# Patient Record
Sex: Female | Born: 1990 | Race: Black or African American | Hispanic: No | Marital: Single | Smoking: Never smoker
Health system: Southern US, Community
[De-identification: ages and names within clinical notes are randomized; demographics above are authoritative.]

---
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
## 2010-10-05 LAB — URINALYSIS, ROUTINE W REFLEX MICROSCOPIC
Glucose, UA: 100 — AB
pH: 6

## 2010-10-05 LAB — URINE MICROSCOPIC-ADD ON

## 2017-10-27 ENCOUNTER — Encounter (HOSPITAL_BASED_OUTPATIENT_CLINIC_OR_DEPARTMENT_OTHER): Payer: Self-pay | Admitting: *Deleted

## 2017-10-27 ENCOUNTER — Emergency Department (HOSPITAL_BASED_OUTPATIENT_CLINIC_OR_DEPARTMENT_OTHER)
Admission: EM | Admit: 2017-10-27 | Discharge: 2017-10-27 | Disposition: A | Discharge status: Home / Self Care | Payer: BC Managed Care – PPO | Attending: Emergency Medicine | Admitting: Emergency Medicine

## 2017-10-27 ENCOUNTER — Other Ambulatory Visit: Payer: Self-pay

## 2017-10-27 ENCOUNTER — Emergency Department (HOSPITAL_BASED_OUTPATIENT_CLINIC_OR_DEPARTMENT_OTHER): Payer: BC Managed Care – PPO

## 2017-10-27 DIAGNOSIS — Y9389 Activity, other specified: Secondary | ICD-10-CM | POA: Insufficient documentation

## 2017-10-27 DIAGNOSIS — Y929 Unspecified place or not applicable: Secondary | ICD-10-CM | POA: Insufficient documentation

## 2017-10-27 DIAGNOSIS — W1849XA Other slipping, tripping and stumbling without falling, initial encounter: Secondary | ICD-10-CM | POA: Insufficient documentation

## 2017-10-27 DIAGNOSIS — S76311A Strain of muscle, fascia and tendon of the posterior muscle group at thigh level, right thigh, initial encounter: Secondary | ICD-10-CM | POA: Diagnosis not present

## 2017-10-27 DIAGNOSIS — Y999 Unspecified external cause status: Secondary | ICD-10-CM | POA: Diagnosis not present

## 2017-10-27 DIAGNOSIS — M79604 Pain in right leg: Secondary | ICD-10-CM | POA: Diagnosis present

## 2017-10-27 MED ORDER — IBUPROFEN 800 MG PO TABS: 800.0000 mg | ORAL_TABLET | Freq: Three times a day (TID) | ORAL | 0 refills | Status: AC | PRN

## 2017-10-27 MED ORDER — IBUPROFEN 800 MG PO TABS
800.0000 mg | ORAL_TABLET | Freq: Once | ORAL | Status: AC
  Administered 2017-10-27: 800 mg via ORAL
  Filled 2017-10-27: qty 1

## 2017-10-27 NOTE — ED Notes (Signed)
ED Provider at bedside. 

## 2017-10-27 NOTE — ED Triage Notes (Signed)
Pt states she was running and her boot caught and she fell landing on her right knee. C/o pain with movement and walking. Also c/o pain posterior right thigh

## 2017-10-27 NOTE — ED Provider Notes (Signed)
[facility] EMERGENCY DEPARTMENT Provider Note   CSN: [id number] Arrival date & time: 10/27/17  1928     History   Chief Complaint Chief Complaint  Patient presents with  . Knee Injury    HPI Amy Acosta is a 27 y.o. female.  No significant past medical history.  She is here for evaluation of knee and hamstring pain after a fall.  She said she was running around chasing her nieces and nephews and slipped, and then had posterior leg pain.  She is tried nothing for it.  It is worse with any kind of weightbearing or ambulation.  No fevers no chills no abdominal pain.  No numbness or weakness.  The history is provided by the patient.  Leg Pain   This is a new problem. The current episode started 3 to 5 hours ago. The problem occurs constantly. The problem has not changed since onset.The pain is present in the right upper leg. The quality of the pain is described as sharp. The pain is moderate. Associated symptoms include limited range of motion. Pertinent negatives include no numbness. The symptoms are aggravated by activity and standing. She has tried nothing for the symptoms. The treatment provided no relief.    History reviewed. No pertinent past medical history.  There are no active problems to display for this patient.   History reviewed. No pertinent surgical history.   OB History   None      Home Medications    Prior to Admission medications   Not on File    Family History No family history on file.  Social History Social History   Tobacco Use  . Smoking status: Never Smoker  . Smokeless tobacco: Never Used  Substance Use Topics  . Alcohol use: Not Currently  . Drug use: Never     Allergies   Patient has no known allergies.   Review of Systems Review of Systems  Constitutional: Negative for fever.  Respiratory: Negative for shortness of breath.   Cardiovascular: Negative for chest pain.  Gastrointestinal: Negative for abdominal  pain.  Musculoskeletal: Negative for back pain.  Skin: Negative for rash.  Neurological: Negative for numbness.     Physical Exam Updated Vital Signs BP 125/88 (BP Location: Left Arm)   Pulse (!) 18   Temp 98.5 F (36.9 C) (Oral)   Resp 18   Ht 5\' 5"  (1.651 m)   Wt 77.1 kg   LMP 10/24/2017   SpO2 100%   BMI 28.29 kg/m   Physical Exam  Constitutional: She appears well-developed and well-nourished.  HENT:  Head: Normocephalic and atraumatic.  Eyes: Conjunctivae are normal.  Neck: Neck supple.  Musculoskeletal:  She is no pain around the knee.  No significant effusion.  Her extensor mechanism is intact.  Calf and ankle nontender.  She has posterior thigh tenderness in the hamstring about midway.  She has pain with any kind of range of motion of that area.  No hip or back pain.  Neurological: She is alert. GCS eye subscore is 4. GCS verbal subscore is 5. GCS motor subscore is 6.  Skin: Skin is warm and dry.  Psychiatric: She has a normal mood and affect.     ED Treatments / Results  Labs (all labs ordered are listed, but only abnormal results are displayed) Labs Reviewed - No data to display  EKG None  Radiology Dg Knee Complete 4 Views Right  Result Date: 10/27/2017 CLINICAL DATA:  Fall on right knee, pain  EXAM: RIGHT KNEE - COMPLETE 4+ VIEW COMPARISON:  None. FINDINGS: No evidence of fracture, dislocation, or joint effusion. No evidence of arthropathy or other focal bone abnormality. Soft tissues are unremarkable. IMPRESSION: Negative. Electronically Signed   By: Charlett Nose M.D.   On: 10/27/2017 20:00    Procedures Procedures (including critical care time)  Medications Ordered in ED Medications - No data to display   Initial Impression / Assessment and Plan / ED Course  I have reviewed the triage vital signs and the nursing notes.  Pertinent labs & imaging results that were available during my care of the patient were reviewed by me and considered in my  medical decision making (see chart for details).    She has no particular pain about the knee but definitely has some mid hamstring pain.  I do not feel a palpable defect but she is quite tender there.  We placed her on some crutches and I referred her onto sports medicine for further evaluation.   Final Clinical Impressions(s) / ED Diagnoses   Final diagnoses:  Hamstring strain, right, initial encounter    ED Discharge Orders         Ordered    ibuprofen (ADVIL,MOTRIN) 800 MG tablet  Every 8 hours PRN     10/27/17 2037           Terrilee Files, MD 10/27/17 [phone]

## 2017-10-27 NOTE — Discharge Instructions (Signed)
You were evaluated in the emergency room for pain in the back of your thigh after a fall.  Your knee x-rays did not show any acute fractures.  He seemed most tender in the hamstring and there is likely a tear in that area.  you should use ice to the affected area and take ibuprofen for pain and inflammation.

## 2017-10-28 ENCOUNTER — Encounter: Payer: Self-pay | Admitting: Family Medicine

## 2017-10-28 ENCOUNTER — Ambulatory Visit: Payer: BC Managed Care – PPO | Admitting: Family Medicine

## 2017-10-28 VITALS — BP 137/80 | HR 101 | Ht 66.0 in | Wt 170.0 lb

## 2017-10-28 DIAGNOSIS — S76311A Strain of muscle, fascia and tendon of the posterior muscle group at thigh level, right thigh, initial encounter: Secondary | ICD-10-CM

## 2017-10-28 DIAGNOSIS — S83411A Sprain of medial collateral ligament of right knee, initial encounter: Secondary | ICD-10-CM | POA: Diagnosis not present

## 2017-10-28 DIAGNOSIS — M25561 Pain in right knee: Secondary | ICD-10-CM | POA: Diagnosis not present

## 2017-10-28 NOTE — Progress Notes (Signed)
PCP: [facility]  Subjective:   HPI: Patient is a 27 y.o. female here for right knee pain. Pt reports 8/10 right knee pain since yesterday, sharp. Pt states she tripped and fell forward and twisted her leg. She impacted the anterior lateral knee. She went to the ED yesterday and had xrays performed. Today she reports swelling, knee and hamstring pain. She has 8/10 pain worse with weight bearing and has been using crutches. She also has pain with flexion of the knee. She takes ibuprofen which is minimally helpful. Effusion present. No erythema or bruising. No increased warmth, no numbness or tingling. No skin changes.  No past medical history on file.  Current Outpatient Medications on File Prior to Visit  Medication Sig Dispense Refill  . diphenhydrAMINE (BENADRYL) 25 MG tablet Take 50 mg by mouth at bedtime.    Marland Kitchen ibuprofen (ADVIL,MOTRIN) 800 MG tablet Take 1 tablet (800 mg total) by mouth every 8 (eight) hours as needed for moderate pain. 21 tablet 0   No current facility-administered medications on file prior to visit.     No past surgical history on file.  No Known Allergies  Social History   Socioeconomic History  . Marital status: Single    Spouse name: Not on file  . Number of children: Not on file  . Years of education: Not on file  . Highest education level: Not on file  Occupational History  . Not on file  Social Needs  . Financial resource strain: Not on file  . Food insecurity:    Worry: Not on file    Inability: Not on file  . Transportation needs:    Medical: Not on file    Non-medical: Not on file  Tobacco Use  . Smoking status: Never Smoker  . Smokeless tobacco: Never Used  Substance and Sexual Activity  . Alcohol use: Not Currently  . Drug use: Never  . Sexual activity: Not on file  Lifestyle  . Physical activity:    Days per week: Not on file    Minutes per session: Not on file  . Stress: Not on file  Relationships  . Social  connections:    Talks on phone: Not on file    Gets together: Not on file    Attends religious service: Not on file    Active member of club or organization: Not on file    Attends meetings of clubs or organizations: Not on file    Relationship status: Not on file  . Intimate partner violence:    Fear of current or ex partner: Not on file    Emotionally abused: Not on file    Physically abused: Not on file    Forced sexual activity: Not on file  Other Topics Concern  . Not on file  Social History Narrative  . Not on file    No family history on file.  BP 137/80   Pulse (!) 101   Ht 5\' 6"  (1.676 m)   Wt 170 lb (77.1 kg)   LMP 10/24/2017   BMI 27.44 kg/m   Review of Systems: See HPI above.     Objective:  Physical Exam:  Gen: awake, alert, NAD, comfortable in exam room Pulm: breathing unlabored  Right Knee: - Inspection: no gross deformity. Moderate effusion. No erythema or bruising. Skin intact - Palpation: TTP over the medial and lateral knee joint lines. TTP over the biceps femoris at musculotendinous junction. Tendons palpable and intact - ROM: full extension  at the knee. Flexion limited to due pain/pressure - Strength: 4+/5 strength in knee flexion, 5/5 extension. 5/5 strength at the ankle - Neuro/vasc: NV intact - Special Tests: - LIGAMENTS: ACL feels intact with ant drawer however testing limited due to pt anxiety and guarding. MCL laxity with firm endpoint compared to left. LCL normal  -- MENISCUS: minimal pain with mcmurrays. unable to perform thessaly -- PF JOINT: nml patellar mobility without apprehension  Left Knee: - Inspection: no gross deformity. Noeffusion, erythema or bruising - Palpation: no TTP - ROM: Full ROM of the knee - Strength: 5/5 strength - Neuro/vasc: NV intact - Special Tests: - LIGAMENTS: No ACL laxity. No MCL or LCL laxity   Hips: no pain with passive IR/ER   Assessment & Plan:  1.  Right knee pain-patient likely has a  combination of hamstring strain, MCL sprain, and knee contusion.  Based on her guarding we are unable to adequately test her ACL today.  With the degree of effusion she has, there is concern for intra-articular derangement. - Today we will place her in a hinged knee brace for support - Quadricep strengthening - Continue NSAIDs or Tylenol as needed for pain - Ice 15 minutes 3-4 times a day - Follow-up in 2 weeks

## 2017-10-28 NOTE — Patient Instructions (Signed)
You have sprained your MCL, strained your hamstring, and have a knee contusion. Icing 15 minutes at a time 3-4 times a day. Ibuprofen 600mg  three times a day with food for pain and inflammation. Tylenol as needed for pain is ok in addition to this. Knee brace when up and walking around. Straight leg raises, knee extensions 3 sets of 10 once a day. Follow up with me in 2 weeks for reevaluation.

## 2017-10-29 ENCOUNTER — Encounter: Payer: Self-pay | Admitting: Family Medicine

## 2017-11-04 ENCOUNTER — Telehealth: Payer: Self-pay | Admitting: Family Medicine

## 2017-11-04 NOTE — Telephone Encounter (Signed)
Patient was informed. Will call the office if she starts to experience pain in her calf

## 2017-11-04 NOTE — Telephone Encounter (Signed)
Patient called back stating she took the brace off and walked and experienced some pain and tightness. Patient is asking if she needs to be seen earlier than her follow up on 11/4 or only if she experiences extreme pain

## 2017-11-04 NOTE — Telephone Encounter (Signed)
It's pretty typical for the swelling to work its way down the leg - as it improves at the knee you'll see it down in the lower leg and ankle.  She should elevate above her heart, ice where the swelling is, consider compression wrap or stockings.  If she's having calf pain with this we should see her back in the office.

## 2017-11-04 NOTE — Telephone Encounter (Signed)
Patient was informed. She says she is at a 3/10 pain level right now and will call if she starts to feel worse

## 2017-11-04 NOTE — Telephone Encounter (Signed)
I'm expecting some pain and tightness but it's tough to analyze over the phone.  If it's 4/10 level or more in the calf then we need to see her though.

## 2017-11-04 NOTE — Telephone Encounter (Signed)
Patient calling with concerns. The swelling in her knee has gone down but she started experiencing swelling in her ankle last Wednesday. States she has been icing and elevating but has not seen any improvement.

## 2017-11-11 ENCOUNTER — Encounter: Payer: Self-pay | Admitting: Family Medicine

## 2017-11-11 ENCOUNTER — Ambulatory Visit: Payer: BC Managed Care – PPO | Admitting: Family Medicine

## 2017-11-11 VITALS — BP 130/85 | HR 96 | Ht 66.0 in | Wt 170.0 lb

## 2017-11-11 DIAGNOSIS — M25561 Pain in right knee: Secondary | ICD-10-CM | POA: Diagnosis not present

## 2017-11-11 NOTE — Progress Notes (Signed)
PCP: [facility]  Subjective:   HPI: 10/21: Patient is a 27 y.o. female here for right knee pain. Pt reports 8/10 right knee pain since yesterday, sharp. Pt states she tripped and fell forward and twisted her leg. She impacted the anterior lateral knee. She went to the ED yesterday and had xrays performed. Today she reports swelling, knee and hamstring pain. She has 8/10 pain worse with weight bearing and has been using crutches. She also has pain with flexion of the knee. She takes ibuprofen which is minimally helpful. Effusion present. No erythema or bruising. No increased warmth, no numbness or tingling. No skin changes.  11/4: Patient here for follow-up of right knee and hamstring pain.  Her hamstring pain has resolved.  She continues to have right knee pain.  It is 1/10 at rest and over the medial knee, sore.  It is worse with weightbearing, flexion, and walking.  She continues to use OTC ibuprofen for pain which helps.  She is wearing a hinged knee brace and using crutches for ambulation.  She reports experiencing some anterior hip pain and lower extremity swelling as well.  She reports continued knee swelling.  No erythema or bruising.  No skin changes.  No past medical history on file.  Current Outpatient Medications on File Prior to Visit  Medication Sig Dispense Refill  . diphenhydrAMINE (BENADRYL) 25 MG tablet Take 50 mg by mouth at bedtime.    Marland Kitchen ibuprofen (ADVIL,MOTRIN) 800 MG tablet Take 1 tablet (800 mg total) by mouth every 8 (eight) hours as needed for moderate pain. 21 tablet 0   No current facility-administered medications on file prior to visit.     No past surgical history on file.  No Known Allergies  Social History   Socioeconomic History  . Marital status: Single    Spouse name: Not on file  . Number of children: Not on file  . Years of education: Not on file  . Highest education level: Not on file  Occupational History  . Not on file  Social  Needs  . Financial resource strain: Not on file  . Food insecurity:    Worry: Not on file    Inability: Not on file  . Transportation needs:    Medical: Not on file    Non-medical: Not on file  Tobacco Use  . Smoking status: Never Smoker  . Smokeless tobacco: Never Used  Substance and Sexual Activity  . Alcohol use: Not Currently  . Drug use: Never  . Sexual activity: Not on file  Lifestyle  . Physical activity:    Days per week: Not on file    Minutes per session: Not on file  . Stress: Not on file  Relationships  . Social connections:    Talks on phone: Not on file    Gets together: Not on file    Attends religious service: Not on file    Active member of club or organization: Not on file    Attends meetings of clubs or organizations: Not on file    Relationship status: Not on file  . Intimate partner violence:    Fear of current or ex partner: Not on file    Emotionally abused: Not on file    Physically abused: Not on file    Forced sexual activity: Not on file  Other Topics Concern  . Not on file  Social History Narrative  . Not on file    No family history on file.  BP 130/85   Pulse 96   Ht 5\' 6"  (1.676 m)   Wt 170 lb (77.1 kg)   LMP 10/24/2017   BMI 27.44 kg/m   Review of Systems: See HPI above.     Objective:  Physical Exam:  GEN: Awake, alert, NAD Pulm: breathing unlabored  Right knee: - Inspection: no gross deformity. Mild effusion.  No erythema or bruising. Skin intact - Palpation: Tenderness over the medial joint line and MCL - ROM: She is able to reach full extension of the knee.  Limited flexion to 90 degrees. - Strength: 5/5 strength flexion and extension. - Neuro/vasc: NV intact - Special Tests: - LIGAMENTS: negative anterior and posterior drawer, negative Lachman's, laxity appreciated with valgus stress but with endpoint.  Negative varus stress. -- MENISCUS: Medial pain with McMurray's -- PF JOINT: nml patellar mobility without  apprehension  Left knee: No obvious deformity or swelling No tenderness Full range of motion with 5/5 strength N/V intact No cruciate or collateral ligament laxity  Assessment & Plan:  1.  Right knee pain- patient has likely grade 2 MCL +/-medial meniscal tear.  ACL feels intact on exam - Continue hinged knee brace - Continue crutches as needed - Ibuprofen and/or Tylenol as needed for pain - Ice - Recommend she keep leg elevated above the level heart to improve swelling - We will order an MRI to further evaluate her knee - Will refer to physical therapy.  Patient also instructed on basic quadricep strengthening exercises today - Follow-up in about 4 weeks

## 2017-11-11 NOTE — Patient Instructions (Addendum)
You have sprained your MCL, possibly tore your medial meniscus but I'm hopeful this is only a contusion. Icing 15 minutes at a time 3-4 times a day. Ibuprofen 600mg  three times a day with food for pain and inflammation as needed. Tylenol as needed for pain is ok in addition to this. Knee brace when up and walking around. Straight leg raises, knee extensions, hamstring curls, quad sets 3 sets of 10 once a day. We will go ahead with an MRI to further assess also. Start physical therapy and do home exercises on days you don't go to therapy. Tentatively follow up in 4 weeks.

## 2017-12-09 ENCOUNTER — Ambulatory Visit: Payer: BC Managed Care – PPO | Admitting: Family Medicine

## 2019-12-18 IMAGING — DX DG KNEE COMPLETE 4+V*R*
4 series · 4 of 4 positions shown · non-contrast
Comparison: None.

CLINICAL DATA: Fall on right knee, pain

EXAM:
RIGHT KNEE - COMPLETE 4+ VIEW

[knee ap]
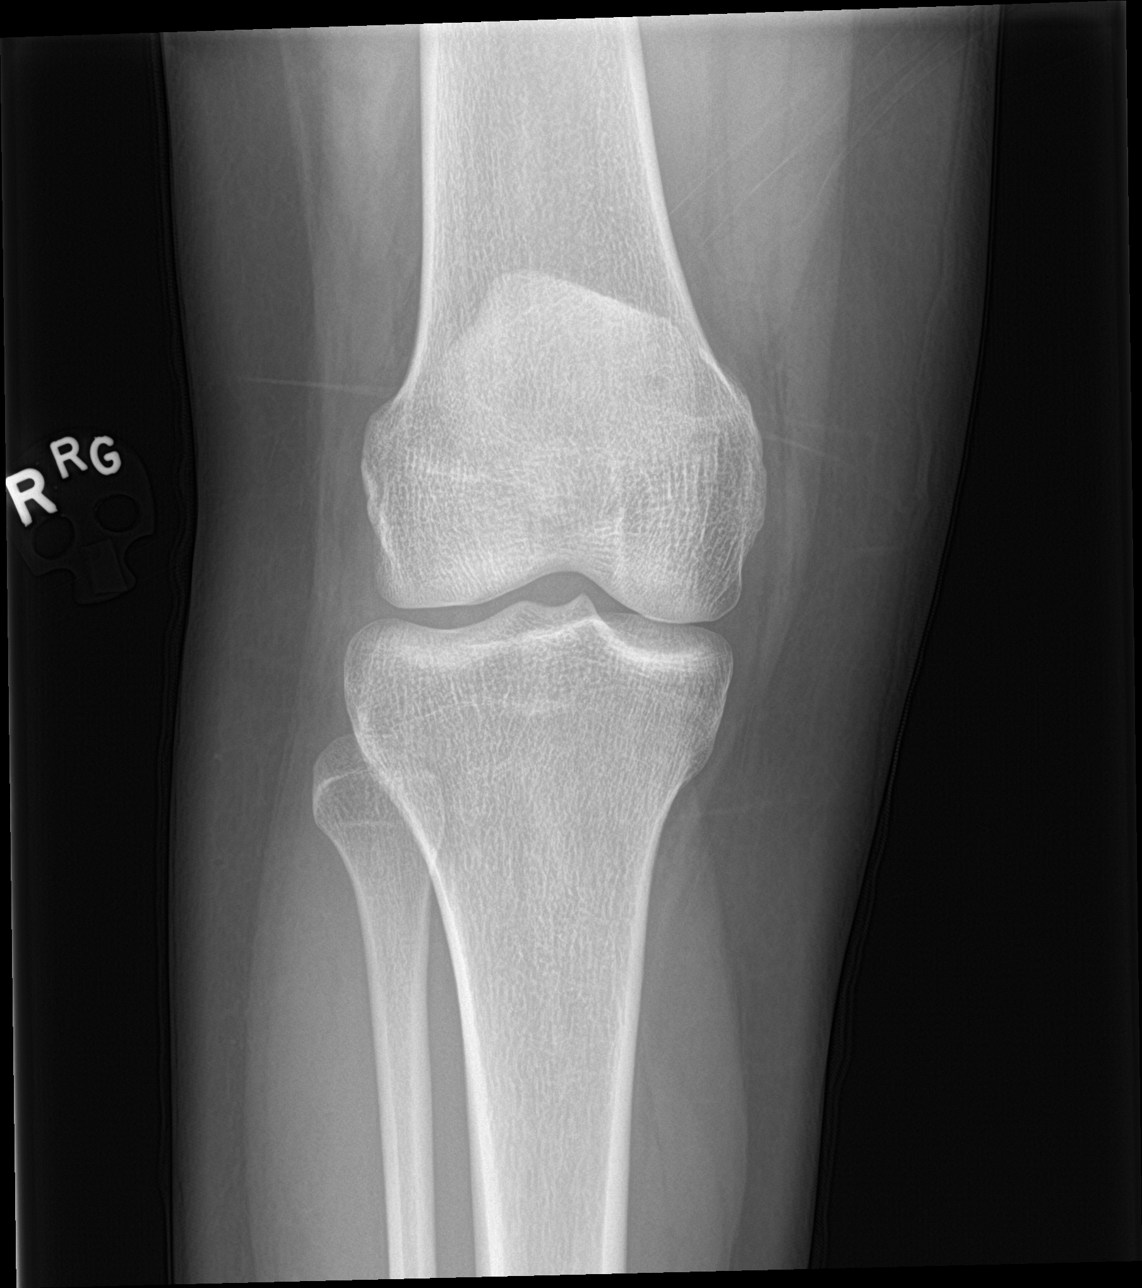

[tunnel]
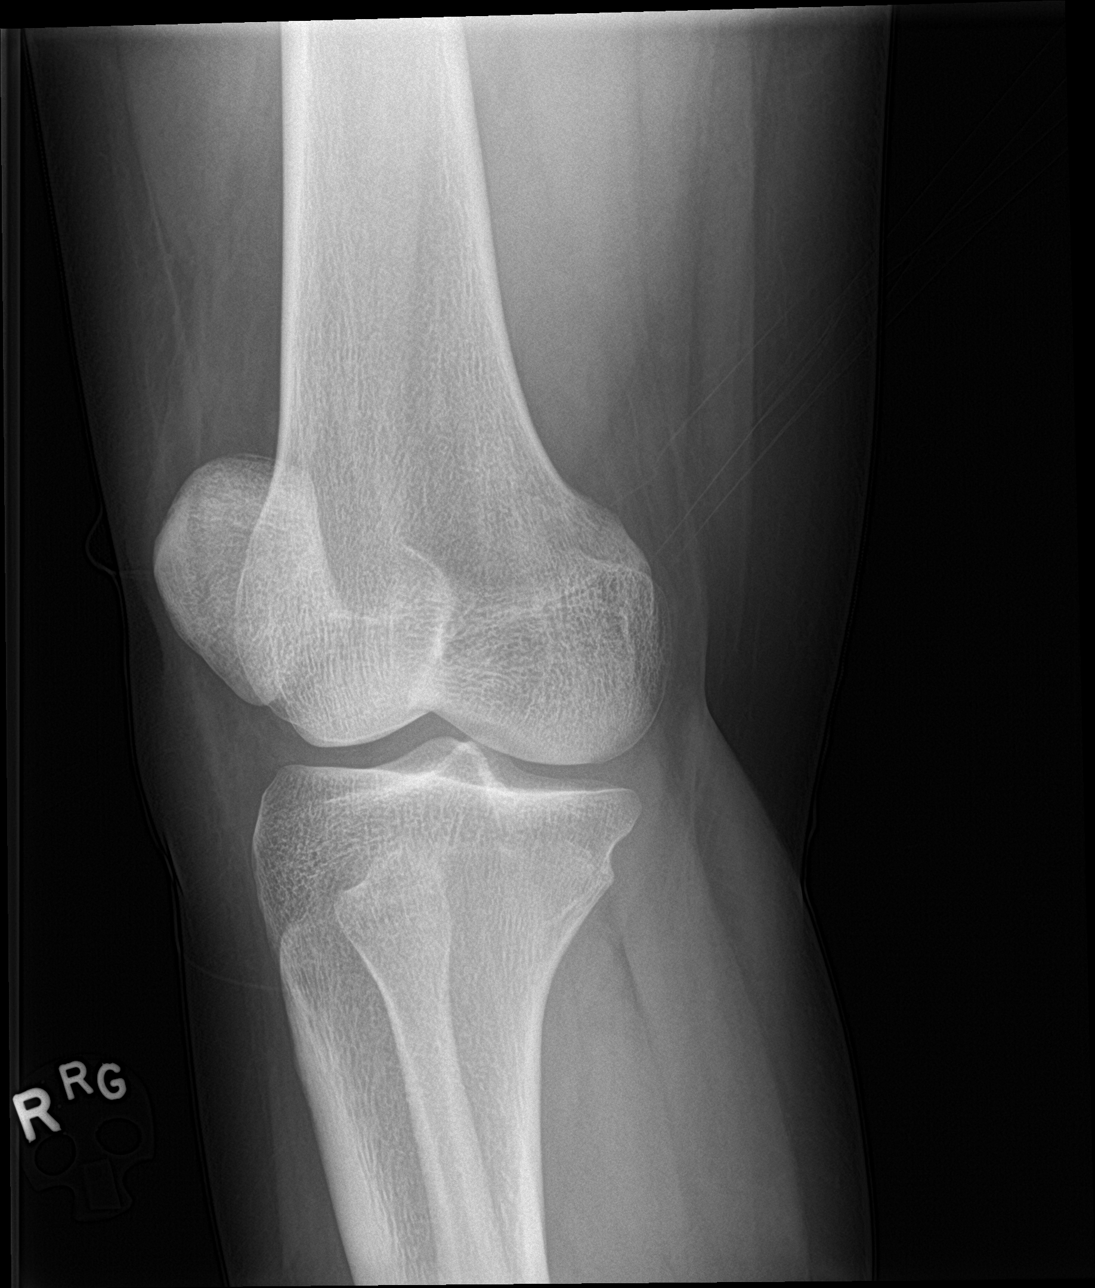

[knee lat]
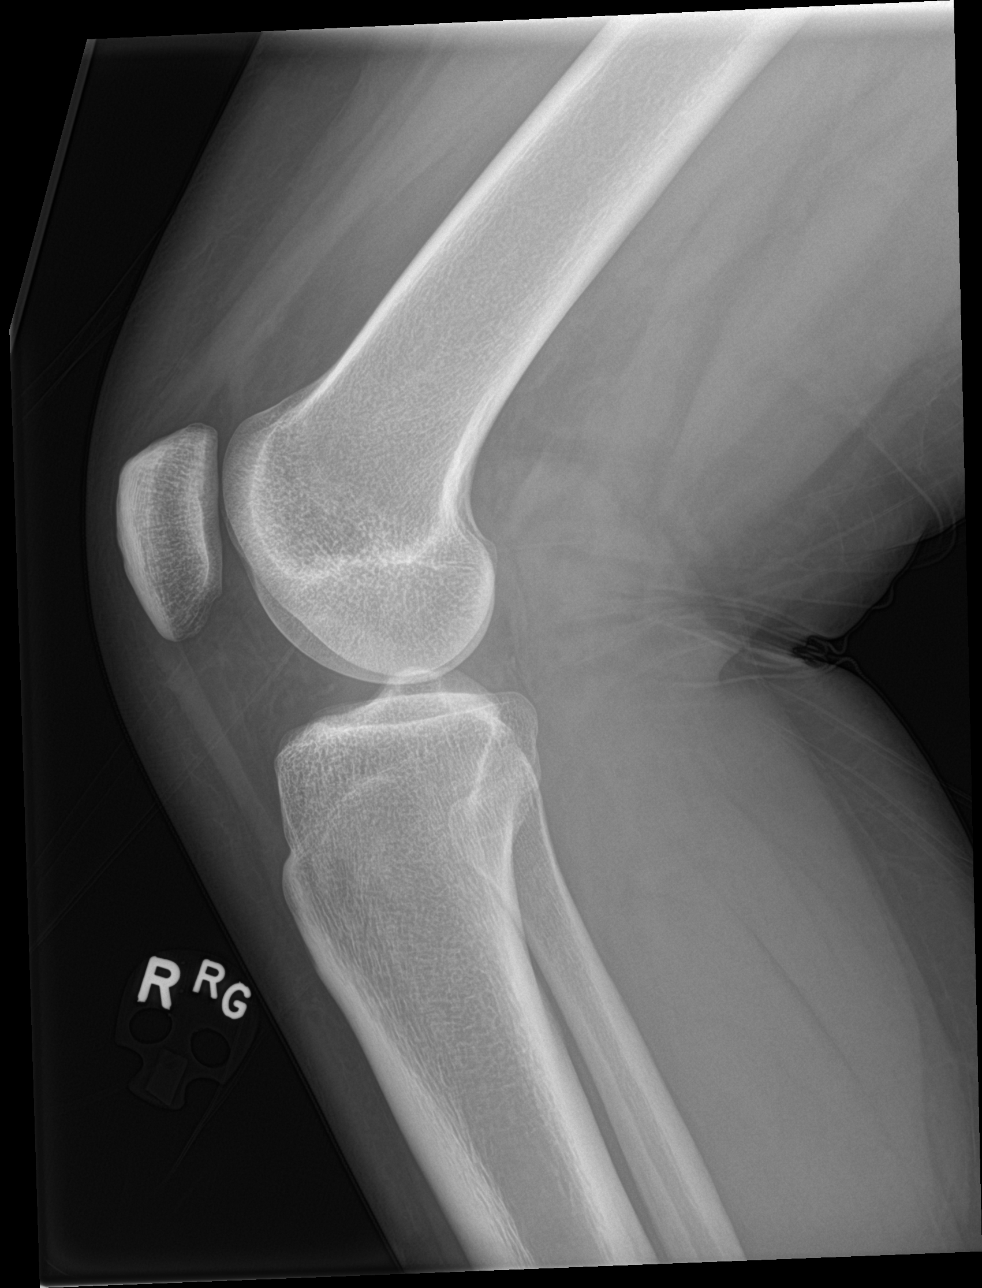

[knee obl]
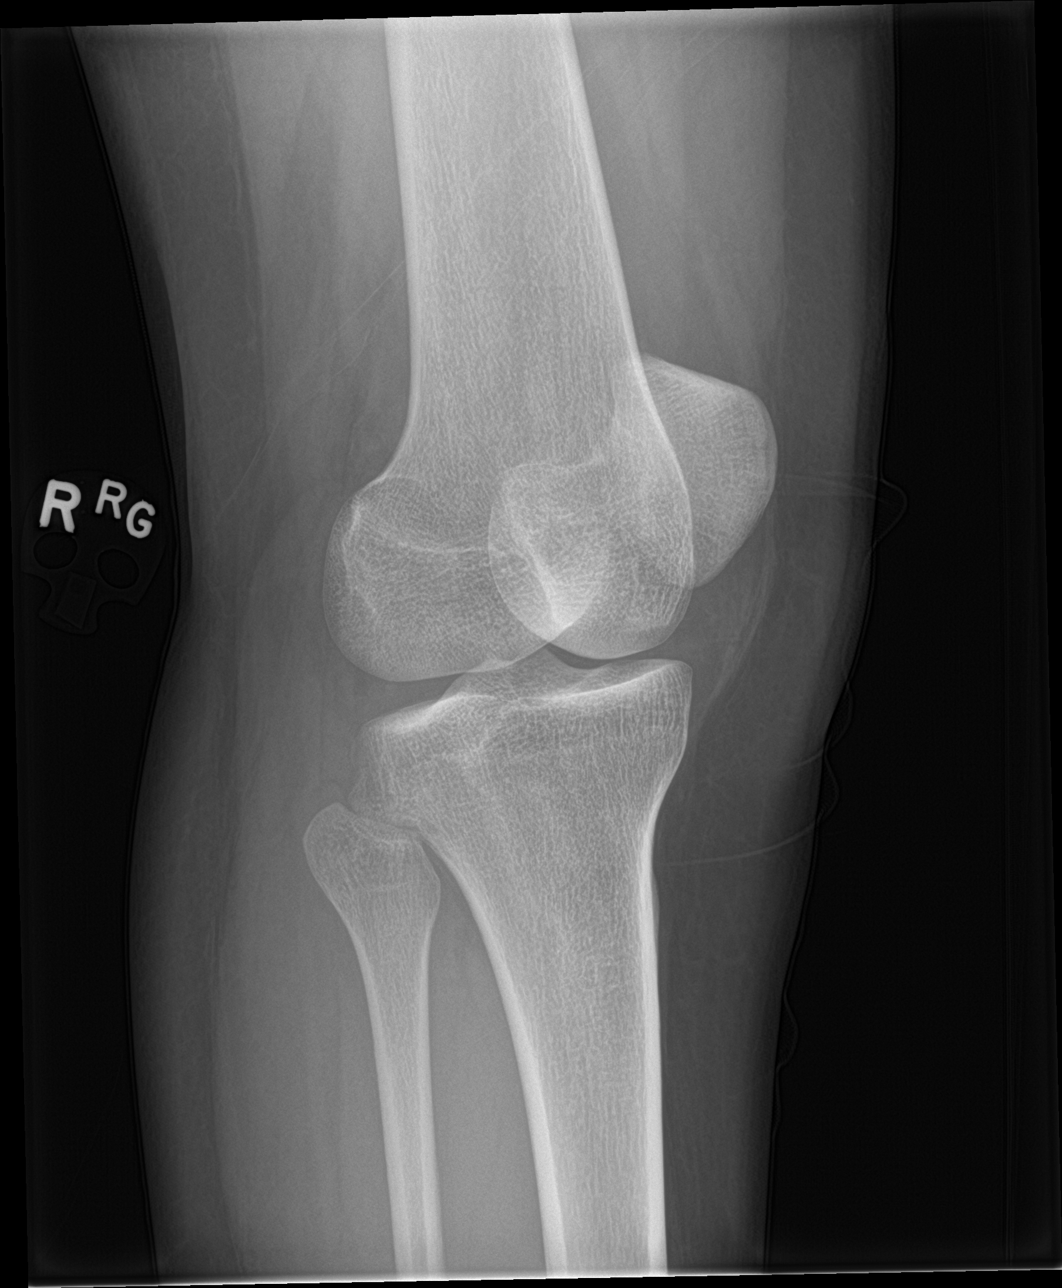

[4 of 4 positions shown; findings below may reference images not displayed]

FINDINGS: No evidence of fracture, dislocation, or joint effusion. No evidence
of arthropathy or other focal bone abnormality. Soft tissues are
unremarkable.
IMPRESSION: Negative.
